# Patient Record
Sex: Male | Born: 1985 | Race: White | Hispanic: No | Marital: Married | Smoking: Current every day smoker
Health system: Southern US, Community
[De-identification: ages and names within clinical notes are randomized; demographics above are authoritative.]

## PROBLEM LIST (undated history)

## (undated) DIAGNOSIS — Z8619 Personal history of other infectious and parasitic diseases: Secondary | ICD-10-CM

## (undated) DIAGNOSIS — G43909 Migraine, unspecified, not intractable, without status migrainosus: Secondary | ICD-10-CM

## (undated) DIAGNOSIS — Z8659 Personal history of other mental and behavioral disorders: Secondary | ICD-10-CM

## (undated) DIAGNOSIS — R51 Headache: Secondary | ICD-10-CM

## (undated) HISTORY — DX: Headache: R51

## (undated) HISTORY — DX: Personal history of other infectious and parasitic diseases: Z86.19

## (undated) HISTORY — DX: Migraine, unspecified, not intractable, without status migrainosus: G43.909

## (undated) HISTORY — PX: HERNIA REPAIR: SHX51

## (undated) HISTORY — DX: Personal history of other mental and behavioral disorders: Z86.59

---

## 2009-09-07 ENCOUNTER — Ambulatory Visit: Payer: Self-pay | Admitting: Family Medicine

## 2010-02-05 ENCOUNTER — Encounter: Payer: Self-pay | Admitting: Family Medicine

## 2010-03-02 ENCOUNTER — Ambulatory Visit: Payer: Self-pay | Admitting: Family Medicine

## 2010-03-02 DIAGNOSIS — R519 Headache, unspecified: Secondary | ICD-10-CM | POA: Insufficient documentation

## 2010-03-02 DIAGNOSIS — R7402 Elevation of levels of lactic acid dehydrogenase (LDH): Secondary | ICD-10-CM | POA: Insufficient documentation

## 2010-03-02 DIAGNOSIS — Z9189 Other specified personal risk factors, not elsewhere classified: Secondary | ICD-10-CM | POA: Insufficient documentation

## 2010-03-02 DIAGNOSIS — R740 Nonspecific elevation of levels of transaminase and lactic acid dehydrogenase [LDH]: Secondary | ICD-10-CM

## 2010-03-02 DIAGNOSIS — F17200 Nicotine dependence, unspecified, uncomplicated: Secondary | ICD-10-CM | POA: Insufficient documentation

## 2010-03-02 DIAGNOSIS — F329 Major depressive disorder, single episode, unspecified: Secondary | ICD-10-CM

## 2010-03-02 DIAGNOSIS — R51 Headache: Secondary | ICD-10-CM

## 2010-03-07 ENCOUNTER — Ambulatory Visit: Payer: Self-pay | Admitting: Family Medicine

## 2010-03-09 LAB — CONVERTED CEMR LAB
ALT: 56 units/L — ABNORMAL HIGH (ref 0–53)
AST: 36 units/L (ref 0–37)
Albumin: 4.4 g/dL (ref 3.5–5.2)
BUN: 17 mg/dL (ref 6–23)
CO2: 30 meq/L (ref 19–32)
Cholesterol: 219 mg/dL — ABNORMAL HIGH (ref 0–200)
GFR calc non Af Amer: 88.02 mL/min (ref >60)
Glucose, Bld: 98 mg/dL (ref 70–99)
Potassium: 4.3 meq/L (ref 3.5–5.1)
Total Protein: 6.7 g/dL (ref 6.0–8.3)
VLDL: 34.6 mg/dL (ref 0.0–40.0)

## 2010-05-22 NOTE — Assessment & Plan Note (Signed)
Summary: NEW PT TO EST/CLE   Vital Signs:  Patient profile:   25 year old male Height:      74 inches Weight:      273.25 pounds BMI:     35.21 Temp:     97.7 degrees F oral Pulse rate:   84 / minute Pulse rhythm:   regular BP sitting:   106 / 70  (left arm) Cuff size:   large  Vitals Entered By: Delilah Shan CMA Duncan Dull) (March 02, 2010 9:55 AM) CC: New patient to Establish   History of Present Illness: Tob- prev good effect with chantix.  Prev didn't tolerate wellbutrin with mild increase in LFTs per patient.   H/o headaches- R sided, behind the eye.  Last  ~45 min.  Less frequently now.  Prev were happening most days.  Less severe now.  Takes aleve for it and does well with this.  Smoking, 1-2 sprite or pepsi per day.  Some tea.    Due for flu shot.   Preventive Screening-Counseling & Management  Alcohol-Tobacco     Smoking Status: current  Caffeine-Diet-Exercise     Does Patient Exercise: no      Drug Use:  no.    Allergies (verified): No Known Drug Allergies  Past History:  Past Medical History: HEADACHE (ICD-784.0) DEPRESSION (ICD-311); symptoms during time near father's death, resolved MIGRAINE HEADACHE (ICD-346.90) CHICKENPOX, HX OF (ICD-V15.9)    Past Surgical History: Denies surgical history  Family History: Reviewed history and no changes required. Family History Breast cancer 1st degree relative <50, Parents Family History of Colon CA 1st degree relative <60, Parents Family History Diabetes 1st degree relative, Parents Family History of Stroke F 1st degree relative <60, parents M alive, breast CA treated, DM2 F died 19-Sep-2003, multiple sclerosis, colon CA  Sep 18, 2001, died of CVA  Social History: Reviewed history and no changes required. Occupation:  Publishing copy, Medical laboratory scientific officer in [employer] Education:  [employer] Current Smoker, 3/4 PPD as of 18-Sep-2009, prev quit with chantix Alcohol use-yes, a few a week Drug use-no Regular exercise-no From [place],  moved to [place] at age 36.   Married 2007Occupation:  employed Smoking Status:  current Drug Use:  no Does Patient Exercise:  no  Review of Systems       See HPI.  Otherwise negative.    Physical Exam  General:  GEN: nad, alert and oriented HEENT: mucous membranes moist, TM wnl, nasal and OP exam unremarkable NECK: supple w/o LA CV: rrr.  no murmur PULM: ctab, no inc wob ABD: soft, +bs EXT: no edema SKIN: no acute rash    Impression & Recommendations:  Problem # 1:  NONSPEC ELEVATION OF LEVELS OF TRANSAMINASE/LDH (ICD-790.4) Requesting records.    Problem # 2:  TOBACCO ABUSE (ICD-305.1) Rx given with routine precautions.  His updated medication list for this problem includes:    Chantix Starting Month Pak 0.5 Mg X 11 & 1 Mg X 42 Tabs (Varenicline tartrate) .Marland Kitchen... Take as directed    Chantix Continuing Month Pak 1 Mg Tabs (Varenicline tartrate) .Marland Kitchen... Take as directed  Problem # 3:  HEADACHE (ICD-784.0) I d/w patient about smoking.  This may be a migraine and cutting tobacco and soda may help.  follow up as needed.  He agrees.   Complete Medication List: 1)  Milk Thistle 500 Mg Caps (Milk thistle) .... Take 2  capsules  by mouth two times a day 2)  Chantix Starting Month Pak 0.5 Mg X 11 &  1 Mg X 42 Tabs (Varenicline tartrate) .... Take as directed 3)  Chantix Continuing Month Pak 1 Mg Tabs (Varenicline tartrate) .... Take as directed  Patient Instructions: 1)  I would try to get back on chantix.  Let me know if you don't tolerate the medicine or if you have other concerns.  2)  Come back for fasting labs in the next few weeks. You can set this up today.  3)  cmet- 790.4 4)  lipid- v18.0 5)  Take care.  Glad to see you today.  Prescriptions: CHANTIX CONTINUING MONTH PAK 1 MG TABS (VARENICLINE TARTRATE) take as directed  #1 pack x 1   Entered and Authorized by:   Crawford Givens MD   Signed by:   Crawford Givens MD on 03/02/2010   Method used:   Print then Give to Patient    RxID:   [id number] CHANTIX STARTING MONTH PAK 0.5 MG X 11 & 1 MG X 42 TABS (VARENICLINE TARTRATE) take as directed  #1 pack x 0   Entered and Authorized by:   Crawford Givens MD   Signed by:   Crawford Givens MD on 03/02/2010   Method used:   Print then Give to Patient   RxID:   [id number]    Orders Added: 1)  New Patient Level III [84696]   Immunization History:  Tetanus/Td Immunization History:    Tetanus/Td:  historical (04/22/2001)   Immunization History:  Tetanus/Td Immunization History:    Tetanus/Td:  Historical (04/22/2001)  Current Allergies (reviewed today): No known allergies    Appended Document: NEW PT TO EST/CLE Flu Vaccine Consent Questions     Do you have a history of severe allergic reactions to this vaccine? no    Any prior history of allergic reactions to egg and/or gelatin? no    Do you have a sensitivity to the preservative Thimersol? no    Do you have a past history of Guillan-Barre Syndrome? no    Do you currently have an acute febrile illness? no    Have you ever had a severe reaction to latex? no    Vaccine information given and explained to patient? yes    Are you currently pregnant? no    Lot Number:[id number]   Exp Date:10/20/2010   Site Given  Left Deltoid IM Lugene Fuquay CMA (AAMA)  March 05, 2010 8:34 AM

## 2010-05-22 NOTE — Letter (Signed)
Summary: [facility] Open [facility] on [facility]  [facility]   Imported By: Maryln Gottron 03/19/2010 09:11:36  _____________________________________________________________________  External Attachment:    Type:   Image     Comment:   External Document

## 2010-07-09 ENCOUNTER — Encounter: Payer: Self-pay | Admitting: Family Medicine

## 2010-07-09 ENCOUNTER — Ambulatory Visit (INDEPENDENT_AMBULATORY_CARE_PROVIDER_SITE_OTHER): Payer: BC Managed Care – PPO | Admitting: Family Medicine

## 2010-07-09 DIAGNOSIS — J019 Acute sinusitis, unspecified: Secondary | ICD-10-CM | POA: Insufficient documentation

## 2010-07-09 LAB — CONVERTED CEMR LAB: Rapid Strep: NEGATIVE

## 2010-07-19 NOTE — Assessment & Plan Note (Signed)
Summary: ST,SINUS PRESSURE,DRAINAGE/CLE  [employer]   Vital Signs:  Patient profile:   25 year old male Height:      74 inches Weight:      277.25 pounds BMI:     35.73 O2 Sat:      98 % on Room air Temp:     98.2 degrees F oral Pulse rate:   76 / minute Pulse rhythm:   regular BP sitting:   132 / 78  (left arm) Cuff size:   large  Vitals Entered By: Delilah Shan CMA Jeanell Mangan Dull) (July 09, 2010 12:11 PM)  O2 Flow:  Room air CC: ST, sinus pressure, drainage   History of Present Illness: Middle of last week started with ST in AM.  Rhinorrhea and postnasal gtt.  ST increased this AM.  Cough and bending over--> pressure in head, brief.  No FCNAV.  Took generic otc allergy meds w/o much relief.    Cutting down to 50% of smoking.    Allergies: No Known Drug Allergies  Review of Systems       See HPI.  Otherwise negative.    Physical Exam  General:  GEN: nad, alert and oriented HEENT: mucous membranes moist, TM w/o erythema, nasal epithelium injected, OP with cobblestoning NECK: supple w/o LA CV: rrr. PULM: ctab, no inc wob ABD: soft, +bs EXT: no edema    Impression & Recommendations:  Problem # 1:  SINUSITIS - ACUTE-NOS (ICD-461.9) Will hold antibiotics for now.  He is minimally tender to palpation on the frontal sinuses.  start antibiotics if not improved in next few days.  nasal saline in meantime.  He agrees.  Nontoxic.  follow up as needed. Supportive tx o/w in meantime.   His updated medication list for this problem includes:    Amoxicillin 875 Mg Tabs (Amoxicillin) .Marland Kitchen... 1 by mouth two times a day  Complete Medication List: 1)  Amoxicillin 875 Mg Tabs (Amoxicillin) .Marland Kitchen.. 1 by mouth two times a day  Other Orders: Rapid Strep ([address])  Patient Instructions: 1)  Hold the antibiotics for now.  Start them on Wednesday if you aren't improving.  Use nasal saline and rest in the meantime.  Take care.   Prescriptions: AMOXICILLIN 875 MG TABS (AMOXICILLIN) 1 by mouth two  times a day  #20 x 0   Entered and Authorized by:   Crawford Givens MD   Signed by:   Crawford Givens MD on 07/09/2010   Method used:   Print then Give to Patient   RxID:   [id number]    Orders Added: 1)  Rapid Strep [95621] 2)  Est. Patient Level III [30865]    Current Allergies (reviewed today): No known allergies   Laboratory Results  Date/Time Received: July 09, 2010 12:18 PM   Other Tests  Rapid Strep: negative

## 2010-07-19 NOTE — Letter (Signed)
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          Sincerely,    Crawford Givens MD

## 2011-07-31 ENCOUNTER — Encounter: Payer: Self-pay | Admitting: Family Medicine

## 2011-07-31 ENCOUNTER — Ambulatory Visit (INDEPENDENT_AMBULATORY_CARE_PROVIDER_SITE_OTHER): Payer: BC Managed Care – PPO | Admitting: Family Medicine

## 2011-07-31 VITALS — BP 108/68 | HR 76 | Temp 98.7°F | Ht 73.5 in | Wt 235.2 lb

## 2011-07-31 DIAGNOSIS — K5289 Other specified noninfective gastroenteritis and colitis: Secondary | ICD-10-CM

## 2011-07-31 DIAGNOSIS — K529 Noninfective gastroenteritis and colitis, unspecified: Secondary | ICD-10-CM | POA: Insufficient documentation

## 2011-07-31 MED ORDER — ONDANSETRON HCL 4 MG PO TABS: 4.0000 mg | ORAL_TABLET | Freq: Three times a day (TID) | ORAL | Status: AC | PRN

## 2011-07-31 NOTE — Progress Notes (Signed)
Yesterday he was fine.  Ate a late lunch.  Had repeated diarrhea last night.  Vomited last night mult times.  Episodically with both.  HA, aches and pains.  No fevers.  Diarrhea not bloody, black.  Vomitus is food and then thin liquids/dry heaves.  Taking some sips of fluids.  No solids.  He was a little lightheaded this AM.  Last UOP this AM, urine was still light.  No known sick contacts.  No new foods.    Meds, vitals, and allergies reviewed.   ROS: See HPI.  Otherwise, noncontributory.  nad ncat Mmm rrr ctab abd soft, not ttp, normal BS, no rebound Ext w/o edema, normal skin turgor and perfusion

## 2011-07-31 NOTE — Assessment & Plan Note (Signed)
Presumed norovirus, recently in community with similar sx. Not dehydrated by exam, okay for outpatient f/u.  Path/phys d/w pt. He understood.  Use zofran prn for NAV.  Sips of fluids o/w.

## 2011-07-31 NOTE — Patient Instructions (Addendum)
Take the zofran for nausea, take drink sips of nondairy fluids and this should gradually get better.

## 2011-08-02 ENCOUNTER — Telehealth: Payer: Self-pay | Admitting: Family Medicine

## 2011-08-02 NOTE — Telephone Encounter (Signed)
Message left at contact number.  Note left at front desk for pick up or awaiting further instructions.

## 2011-08-02 NOTE — Telephone Encounter (Signed)
Printed

## 2011-08-02 NOTE — Telephone Encounter (Signed)
Patient needs doctor's note to return to work on Monday.

## 2016-04-26 ENCOUNTER — Other Ambulatory Visit: Payer: Self-pay | Admitting: Unknown Physician Specialty

## 2016-04-26 ENCOUNTER — Ambulatory Visit: Admission: RE | Admit: 2016-04-26 | Payer: Self-pay | Source: Ambulatory Visit

## 2016-04-26 DIAGNOSIS — M5432 Sciatica, left side: Secondary | ICD-10-CM

## 2016-05-03 ENCOUNTER — Ambulatory Visit
Admission: RE | Admit: 2016-05-03 | Discharge: 2016-05-03 | Disposition: A | Discharge status: Home / Self Care | Payer: Managed Care, Other (non HMO) | Source: Ambulatory Visit | Attending: Unknown Physician Specialty | Admitting: Unknown Physician Specialty

## 2016-05-03 ENCOUNTER — Ambulatory Visit: Payer: Self-pay

## 2016-05-03 ENCOUNTER — Other Ambulatory Visit: Payer: Self-pay | Admitting: Unknown Physician Specialty

## 2016-05-03 DIAGNOSIS — M5441 Lumbago with sciatica, right side: Secondary | ICD-10-CM

## 2016-05-03 DIAGNOSIS — M5126 Other intervertebral disc displacement, lumbar region: Secondary | ICD-10-CM | POA: Insufficient documentation

## 2016-05-03 DIAGNOSIS — M5432 Sciatica, left side: Secondary | ICD-10-CM | POA: Diagnosis present

## 2016-05-03 DIAGNOSIS — M5127 Other intervertebral disc displacement, lumbosacral region: Secondary | ICD-10-CM | POA: Insufficient documentation

## 2017-11-18 IMAGING — MR MR LUMBAR SPINE W/O CM
4 of 5 series · 16 of 48 positions shown · non-contrast
Comparison: None.

CLINICAL DATA: Low back pain and left leg pain and numbness.

EXAM:
MRI LUMBAR SPINE WITHOUT CONTRAST
TECHNIQUE: Multiplanar, multisequence MR imaging of the lumbar spine was
performed. No intravenous contrast was administered.

[Series 2: T2 · sagittal · 4.0mm · 0.44mm/px · 5 of 16 slices shown (1 of 2)]
[im 1/16]
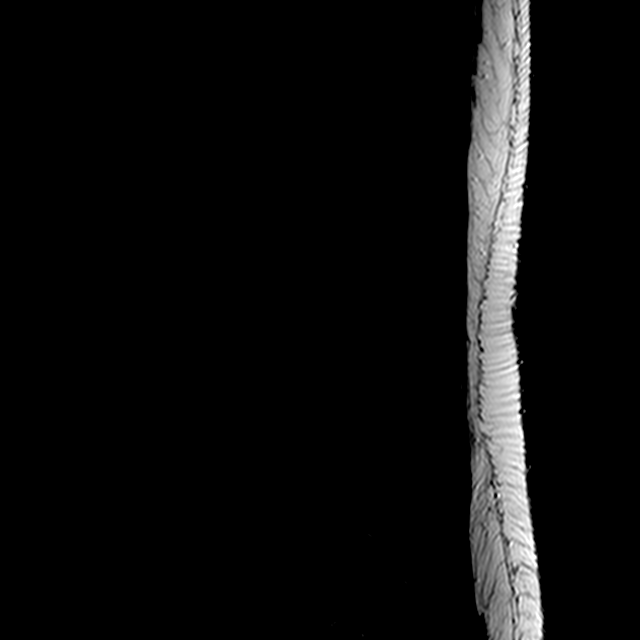
[im 4/16]
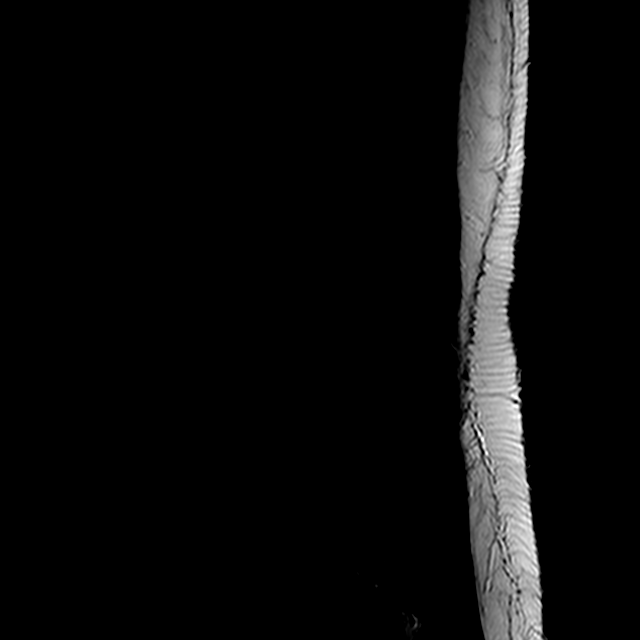
[im 8/16]
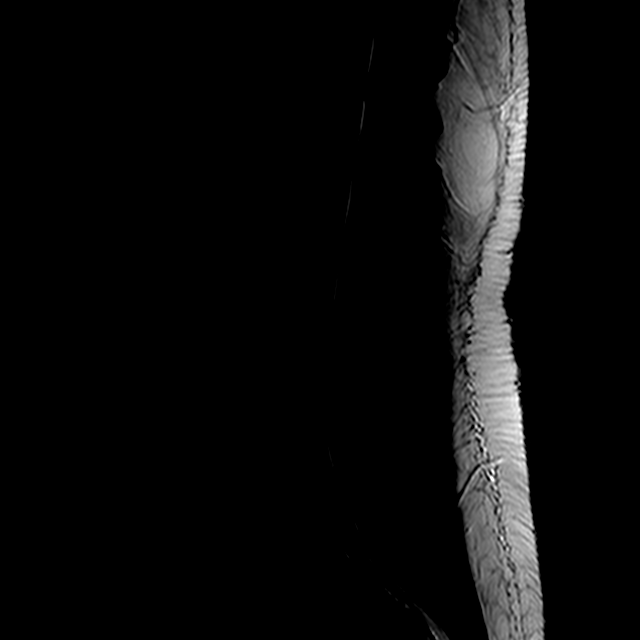
[im 12/16]
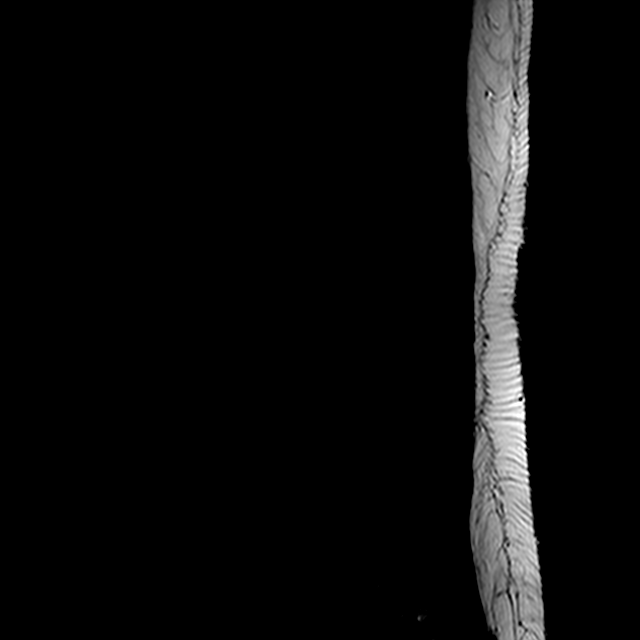
[im 16/16]
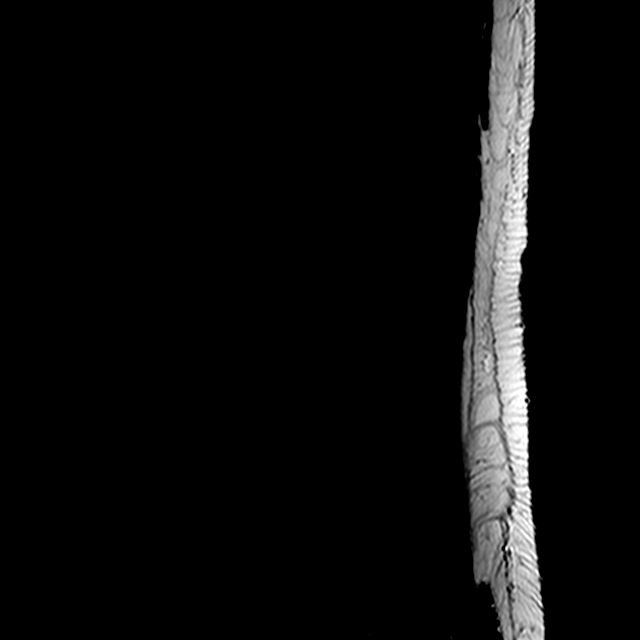

[Series 3: T1 · sagittal · 4.0mm · 0.44mm/px · 3 of 16 slices shown (1 of 2)]
[im 1/16]
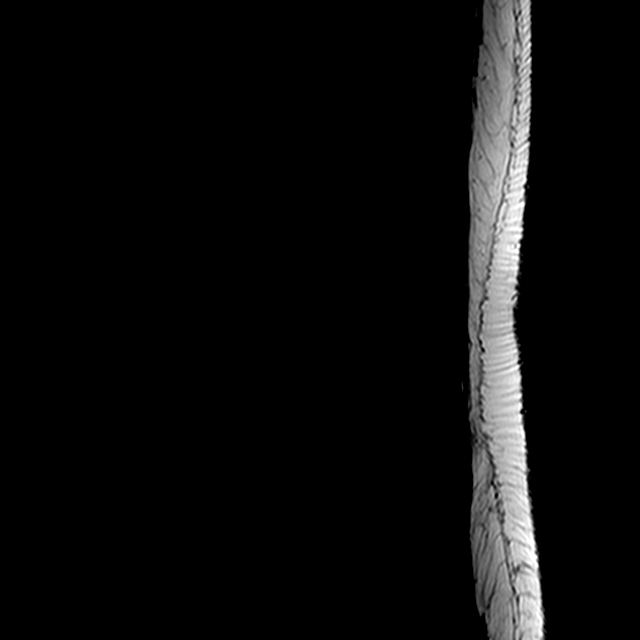
[im 8/16]
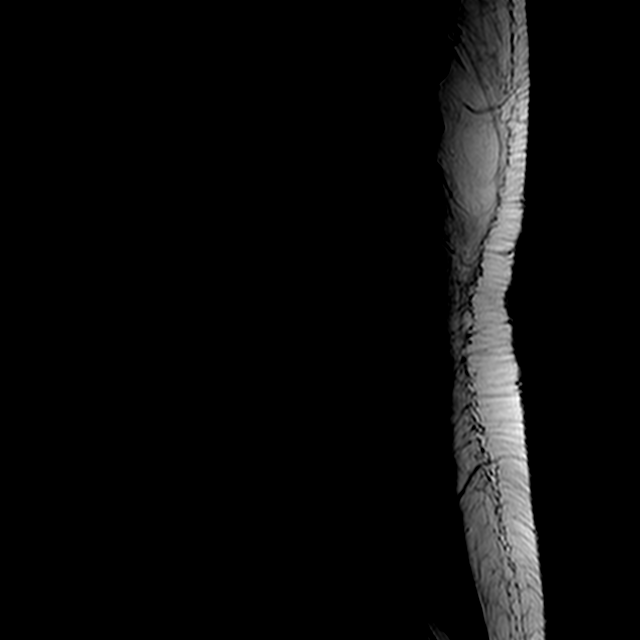
[im 16/16]
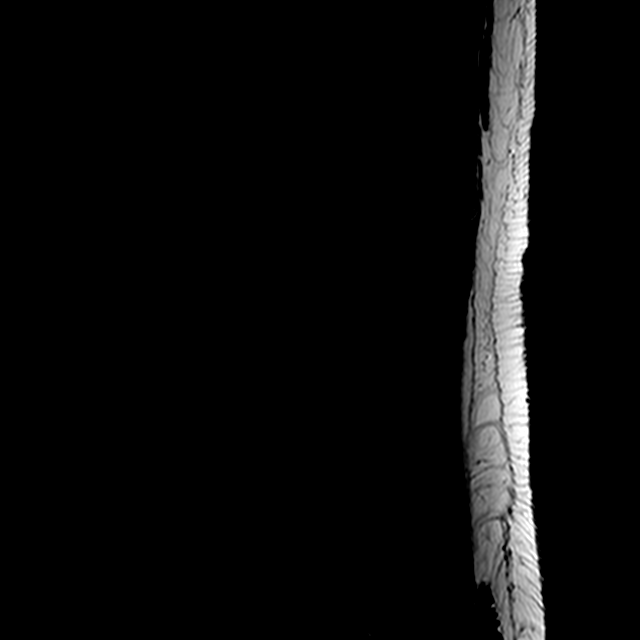

[Series 5: T2 · axial · 4.0mm · 0.39mm/px · z∈[-113,+102]mm · 5 of 45 slices shown (2 of 2)]
[im 3/45]
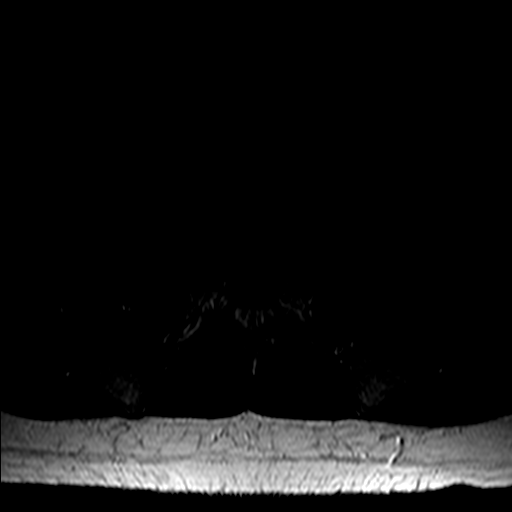
[im 6/45]
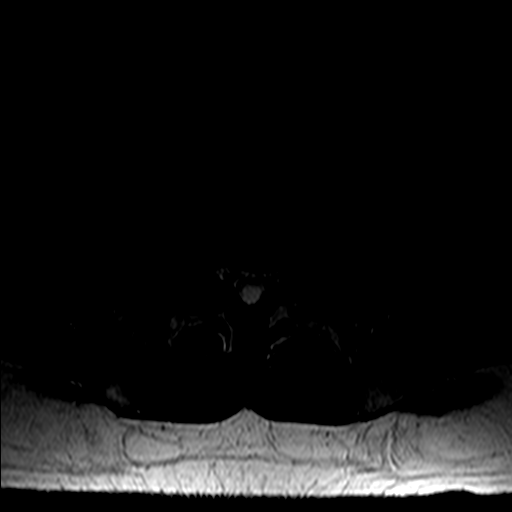
[im 9/45]
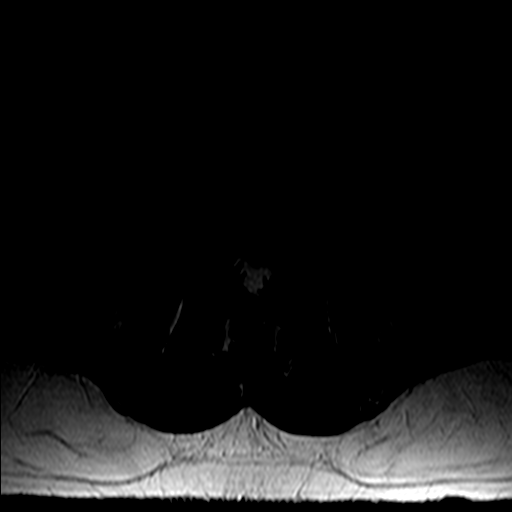
[im 24/45]
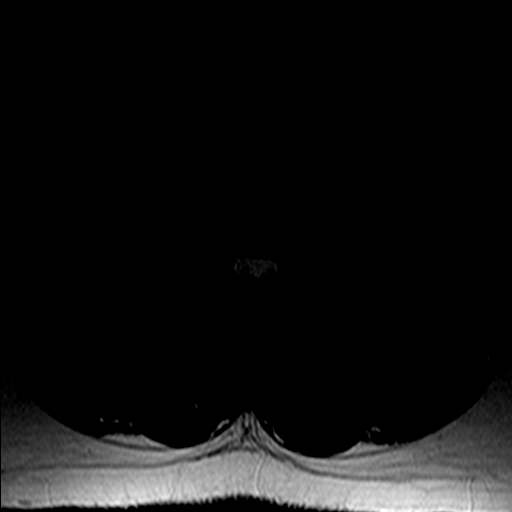
[im 39/45]
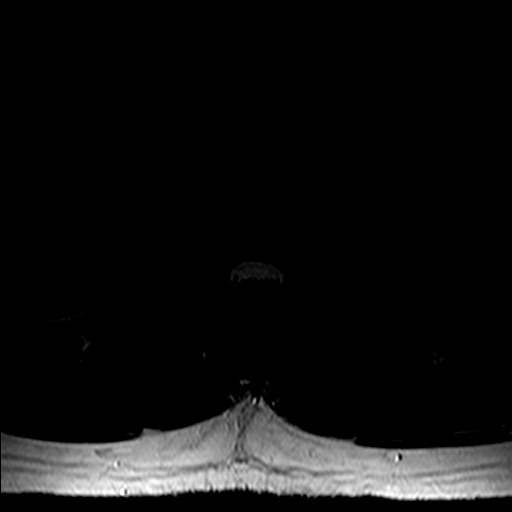

[Series 6: T1 · axial · 4.0mm · 0.39mm/px · z∈[-98,+102]mm · 3 of 45 slices shown (2 of 2)]
[im 6/45]
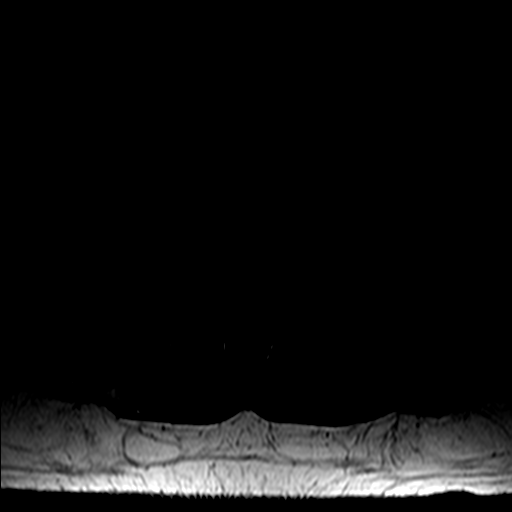
[im 24/45]
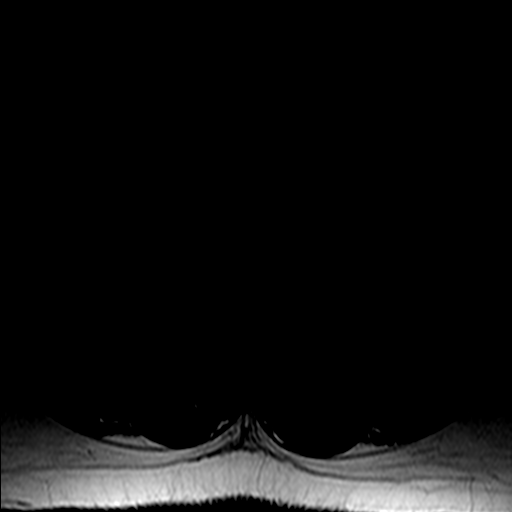
[im 39/45]
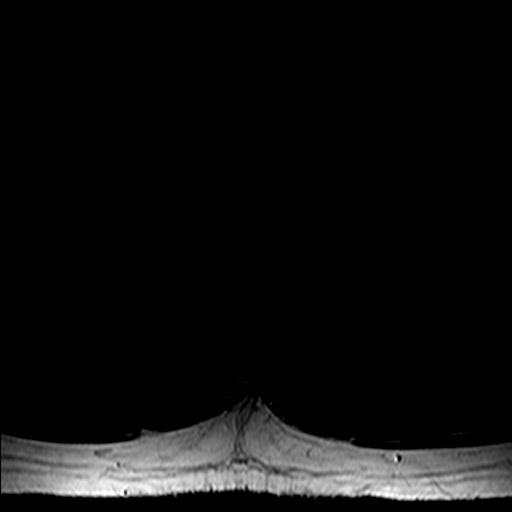

[16 of 48 positions shown; findings below may reference images not displayed]

FINDINGS: Segmentation:  Standard.

Alignment:  Physiologic.

Vertebrae:  No fracture, evidence of discitis, or bone lesion.

Conus medullaris: Extends to the L1 level and appears normal.

Paraspinal and other soft tissues: Negative.

Disc levels:

T11-12 through L2-3: Normal.

L3-4: Small central subligamentous disc protrusion slightly
asymmetric to the right minimally compresses the thecal sac but
creates no focal nerve impingement.

L4-5:  Normal.

L5-S1: Soft disc protrusion central and to the left focally
compressing the left S1 nerve root sleeve best seen on image 38 of
series 5.
IMPRESSION: 1. Small focal soft disc protrusion central and to the left at L5-S1
compressing the left S1 nerve.
2. Small central subligamentous disc protrusion at L3-4 slightly
asymmetric to the right without neural impingement.

## 2021-05-25 ENCOUNTER — Other Ambulatory Visit: Payer: Self-pay

## 2021-05-25 ENCOUNTER — Ambulatory Visit
Admission: RE | Admit: 2021-05-25 | Discharge: 2021-05-25 | Disposition: A | Discharge status: Home / Self Care | Payer: Managed Care, Other (non HMO) | Source: Ambulatory Visit | Attending: Emergency Medicine | Admitting: Emergency Medicine

## 2021-05-25 VITALS — BP 130/84 | HR 81 | Temp 98.5°F | Resp 20

## 2021-05-25 DIAGNOSIS — Z23 Encounter for immunization: Secondary | ICD-10-CM

## 2021-05-25 DIAGNOSIS — S81831A Puncture wound without foreign body, right lower leg, initial encounter: Secondary | ICD-10-CM

## 2021-05-25 MED ORDER — TETANUS-DIPHTHERIA TOXOIDS TD 5-2 LFU IM INJ: 0.5000 mL | INJECTION | Freq: Once | INTRAMUSCULAR | Status: DC

## 2021-05-25 MED ORDER — TETANUS-DIPHTH-ACELL PERTUSSIS 5-2.5-18.5 LF-MCG/0.5 IM SUSY
0.5000 mL | PREFILLED_SYRINGE | Freq: Once | INTRAMUSCULAR | Status: AC
  Administered 2021-05-25: 0.5 mL via INTRAMUSCULAR

## 2021-05-25 NOTE — Discharge Instructions (Signed)
Please make a note in your calendar or on your phone that you received a Tdap booster today (that stands for tetanus, diphtheria and pertussis).  Your next booster shot would officially be due in 10 years but as you may be aware, if you have a similar deep injury or dog bite after 5 years before your 10-year booster, you should get another tetanus booster.  As we discussed, I do not believe that there is any metal remaining in your leg given lack of redness, inflammation and likelihood that you were able to pull out the entire piece intact quickly.    If you do begin to experience inflammation, redness, your ankle joint I recommend that you go to the [facility] to have an x-ray done of your right ankle.  This has been ordered for you, you just need to go to the main entrance during regular business hours to let them know, they will not need to admit you as a patient.  At this time, I do not believe that antibiotics are indicated but certainly if any of the signs of infection listed in the attached instructions appear, please do not hesitate to give [place] a call and I will send a prescription for antibiotics right away.  Thank you for visiting urgent care today.  I appreciate the opportunity to participate in your care.

## 2021-05-25 NOTE — ED Provider Notes (Signed)
[facility]    CSN: [id number] Arrival date & time: 05/25/21  [id number]    HISTORY   Chief Complaint  Patient presents with   Abrasion   HPI Aaron Mathis is a 36 y.o. male. 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  Patient states when he looked down, there was a piece of metal sticking out from his leg, when he pulled it out about three quarters of an inch have been buried into his leg.  Patient states he feels like he got it all out but came in to be evaluated just to make sure.  Patient states he thinks has been about 20 years since he has had a tetanus booster.  Patient states the area is no longer bleeding, denies any redness, swelling, drainage from the puncture wound.  Denies any pain in his ankle with any particular movements or walking.  States that the piece of wire he pulled from his leg was wound appears wound appears patient's wound appears uncontaminated appears uncontaminated and healing well at this time.  There is no palpable mass appreciated.  X-ray return precautions advised, patient received a tetanus booster.  Return precautions advised return return connected to the car and was old, states he does not know how clean it was.  The history is provided by the patient.  Past Medical History:  Diagnosis Date   Headache(784.0)    History of chickenpox    History of depression    symptoms during time near father's dealth, resolved   Migraine headache    Patient Active Problem List   Diagnosis Date Noted   AGE (acute gastroenteritis) 07/31/2011   TOBACCO ABUSE 03/02/2010   DEPRESSION 03/02/2010   HEADACHE 03/02/2010   NONSPEC ELEVATION OF LEVELS OF TRANSAMINASE/LDH 03/02/2010   CHICKENPOX, HX OF 03/02/2010   History reviewed. No pertinent surgical history.  Home Medications    Prior to Admission medications   Medication Sig  Start Date End Date Taking? Authorizing Provider  bismuth subsalicylate (PEPTO BISMOL) 262 MG chewable tablet Chew 524 mg by mouth as needed.    [provider]  Loperamide HCl (IMODIUM A-D PO) Take by mouth as needed.    [provider]    Family History Family History  Problem Relation Age of Onset   Cancer Mother        breast   Diabetes Mother    Multiple sclerosis Father    Cancer Father        colon ~2003   Stroke Father    Cancer Other        breast, colon   Diabetes Other    Stroke Other    Social History Social History   Tobacco Use   Smoking status: Every Day    Packs/day: 0.30    Years: 8.00    Pack years: 2.40    Types: Cigarettes   Smokeless tobacco: Never  Substance Use Topics   Alcohol use: Yes    Comment: occassionally   Drug use: No   Allergies   Patient has no known allergies.  Review of Systems Review of Systems Pertinent findings noted in history of present illness.   Physical Exam Triage Vital Signs ED Triage Vitals  Enc Vitals Group     BP 02/16/21 0827 (!) 147/82     Pulse Rate 02/16/21 0827 72  Resp 02/16/21 0827 18     Temp 02/16/21 0827 98.3 F (36.8 C)     Temp Source 02/16/21 0827 Oral     SpO2 02/16/21 0827 98 %     Weight --      Height --      Head Circumference --      Peak Flow --      Pain Score 02/16/21 0826 5     Pain Loc --      Pain Edu? --      Excl. in GC? --   No data found.  Updated Vital Signs BP 130/84 (BP Location: Left Arm)    Pulse 81    Temp 98.5 F (36.9 C) (Oral)    Resp 20    SpO2 97%   Physical Exam Vitals and nursing note reviewed.  Constitutional:      General: He is not in acute distress.    Appearance: Normal appearance. He is not ill-appearing.  HENT:     Head: Normocephalic and atraumatic.  Eyes:     General: Lids are normal.        Right eye: No discharge.        Left eye: No discharge.     Extraocular Movements: Extraocular movements intact.      Conjunctiva/sclera: Conjunctivae normal.     Right eye: Right conjunctiva is not injected.     Left eye: Left conjunctiva is not injected.  Neck:     Trachea: Trachea and phonation normal.  Cardiovascular:     Rate and Rhythm: Normal rate and regular rhythm.     Pulses: Normal pulses.     Heart sounds: Normal heart sounds. No murmur heard.   No friction rub. No gallop.  Pulmonary:     Effort: Pulmonary effort is normal. No accessory muscle usage, prolonged expiration or respiratory distress.     Breath sounds: Normal breath sounds. No stridor, decreased air movement or transmitted upper airway sounds. No decreased breath sounds, wheezing, rhonchi or rales.  Chest:     Chest wall: No tenderness.  Musculoskeletal:        General: Normal range of motion.     Cervical back: Normal range of motion and neck supple. Normal range of motion.  Lymphadenopathy:     Cervical: No cervical adenopathy.  Skin:    General: Skin is warm and dry.     Findings: Lesion (Multiple linear abrasions on the posterior aspect of right lower leg just above the Achilles tendon, hemostasis has been achieved with blood clot, there is no appreciable erythema, swelling, drainage or visible or palpable foreign body appreciated) present. No erythema or rash.  Neurological:     General: No focal deficit present.     Mental Status: He is alert and oriented to person, place, and time.  Psychiatric:        Mood and Affect: Mood normal.        Behavior: Behavior normal.    Visual Acuity Right Eye Distance:   Left Eye Distance:   Bilateral Distance:    Right Eye Near:   Left Eye Near:    Bilateral Near:     UC Couse / Diagnostics / Procedures:    EKG  Radiology No results found.  Procedures Procedures (including critical care time)  UC Diagnoses / Final Clinical Impressions(s)   I have reviewed the triage vital signs and the nursing notes.  Pertinent labs & imaging results that were available during my  care  of the patient were reviewed by me and considered in my medical decision making (see chart for details).    Final diagnoses:  Puncture wound of right lower leg, initial encounter   Patient's wound appears uncontaminated at this time, there is no palpable mass appreciated on exam.  X-ray ordered, patient advised to have this done if he experiences pain, redness or swelling in ankle or wound.  Patient advised to call if he sees any signs of infection as listed in the puncture wound handout for him, we will be happy to prescribe some Keflex for 7 days.  Return precautions advised, tdap provided.    ED Prescriptions   None    PDMP not reviewed this encounter.  Pending results:  Labs Reviewed - No data to display  Medications Ordered in UC: Medications  tetanus & diphtheria toxoids (adult) (TENIVAC) injection 0.5 mL (has no administration in time range)    Disposition Upon Discharge:  Condition: stable for discharge home Home: take medications as prescribed; routine discharge instructions as discussed; follow up as advised.  Patient presented with an acute illness with associated systemic symptoms and significant discomfort requiring urgent management. In my opinion, this is a condition that a prudent lay person (someone who possesses an average knowledge of health and medicine) may potentially expect to result in complications if not addressed urgently such as respiratory distress, impairment of bodily function or dysfunction of bodily organs.   Routine symptom specific, illness specific and/or disease specific instructions were discussed with the patient and/or caregiver at length.   As such, the patient has been evaluated and assessed, work-up was performed and treatment was provided in alignment with urgent care protocols and evidence based medicine.  Patient/parent/caregiver has been advised that the patient may require follow up for further testing and treatment if the symptoms  continue in spite of treatment, as clinically indicated and appropriate.  If the patient was tested for COVID-19, Influenza and/or RSV, then the patient/parent/guardian was advised to isolate at home pending the results of his/her diagnostic coronavirus test and potentially longer if theyre positive. I have also advised pt that if his/her COVID-19 test returns positive, it's recommended to self-isolate for at least 10 days after symptoms first appeared AND until fever-free for 24 hours without fever reducer AND other symptoms have improved or resolved. Discussed self-isolation recommendations as well as instructions for household member/close contacts as per the [facility] and [place] DHHS, and also gave patient the COVID packet with this information.  Patient/parent/caregiver has been advised to return to the [facility] or PCP in 3-5 days if no better; to PCP or the Emergency Department if new signs and symptoms develop, or if the current signs or symptoms continue to change or worsen for further workup, evaluation and treatment as clinically indicated and appropriate  The patient will follow up with their current PCP if and as advised. If the patient does not currently have a PCP we will assist them in obtaining one.   The patient may need specialty follow up if the symptoms continue, in spite of conservative treatment and management, for further workup, evaluation, consultation and treatment as clinically indicated and appropriate.   Patient/parent/caregiver verbalized understanding and agreement of plan as discussed.  All questions were addressed during visit.  Please see discharge instructions below for further details of plan.  Discharge Instructions:   Discharge Instructions      Please make a note in your calendar or on your phone that you received a Tdap booster  today (that stands for tetanus, diphtheria and pertussis).  Your next booster shot would officially be due in 10 years but as you may be aware, if  you have a similar deep injury or dog bite after 5 years before your 10-year booster, you should get another tetanus booster.  As we discussed, I do not believe that there is any metal remaining in your leg given lack of redness, inflammation and likelihood that you were able to pull out the entire piece intact quickly.    If you do begin to experience inflammation, redness, your ankle joint I recommend that you go to the [facility] at [facility] to have an x-ray done of your right ankle.  This has been ordered for you, you just need to go to the main entrance during regular business hours to let them know, they will not need to admit you as a patient.  At this time, I do not believe that antibiotics are indicated but certainly if any of the signs of infection listed in the attached instructions appear, please do not hesitate to give us a call and I will send a prescription for antibiotics right away.  Thank you for visiting urgent care today.  I appreciate the opportunity to participate in your care.      This office note has been dictated using Teaching laboratory technicianDragon speech recognition software.  Unfortunately, and despite my best efforts, this method of dictation can sometimes lead to occasional typographical or grammatical errors.  I apologize in advance if this occurs.     Theadora RamaMorgan, Brandolyn Shortridge Scales, [place] JerseyPA-C 05/25/21 [phone]

## 2021-05-25 NOTE — ED Triage Notes (Addendum)
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 Pt states that a piece of the metal did get into the leg but he removed what he could see.

## 2021-10-09 ENCOUNTER — Other Ambulatory Visit: Payer: Self-pay

## 2021-10-09 ENCOUNTER — Ambulatory Visit (INDEPENDENT_AMBULATORY_CARE_PROVIDER_SITE_OTHER): Payer: Self-pay | Admitting: Gastroenterology

## 2021-10-09 ENCOUNTER — Encounter: Payer: Self-pay | Admitting: Gastroenterology

## 2021-10-09 VITALS — BP 129/85 | HR 75 | Temp 98.5°F | Ht 74.0 in | Wt 266.0 lb

## 2021-10-09 DIAGNOSIS — Z800 Family history of malignant neoplasm of digestive organs: Secondary | ICD-10-CM

## 2021-10-09 MED ORDER — NA SULFATE-K SULFATE-MG SULF 17.5-3.13-1.6 GM/177ML PO SOLN: 354.0000 mL | Freq: Once | ORAL | 0 refills | Status: AC

## 2021-10-09 NOTE — Addendum Note (Signed)
Addended by: Adela Ports on: 10/09/2021 02:01 PM   Modules accepted: Orders

## 2021-10-09 NOTE — Progress Notes (Signed)
Wyline Mood MD, MRCP(U.K) [address]  [address]  [place], [place] [address]  Main: [phone]  Fax: [phone]   Gastroenterology Consultation  Referring Provider:     Ceasar Lund, [place] Primary Care Physician:  Joaquim Nam, MD Primary Gastroenterologist:  Dr. Wyline Mood  Reason for Consultation:     Father had colon cancer 46        HPI:   Aaron Mathis is a 36 y.o. y/o male referred for consultation & management  by Dr. Para March, Dwana Curd, MD.     He is here today to see me for setting up a colonoscopy.  His father had colon cancer at age of 27 went through chemotherapy and subsequently passed away from a stroke.  He has had sisters who have had colon polyps in their 26s.  He has never had a colonoscopy.  No change in bowel habits.  No unintentional weight loss.  He works at [employer] at [employer].  Father never had genetic testing performed.  Past Medical History:  Diagnosis Date   Headache(784.0)    History of chickenpox    History of depression    symptoms during time near father's dealth, resolved   Migraine headache     No past surgical history on file.  Prior to Admission medications   Not on File    Family History  Problem Relation Age of Onset   Cancer Mother        breast   Diabetes Mother    Multiple sclerosis Father    Cancer Father        colon ~2003   Stroke Father    Cancer Other        breast, colon   Diabetes Other    Stroke Other      Social History   Tobacco Use   Smoking status: Every Day    Packs/day: 0.30    Years: 8.00    Total pack years: 2.40    Types: Cigarettes   Smokeless tobacco: Never  Substance Use Topics   Alcohol use: Yes    Comment: occassionally   Drug use: No    Allergies as of 10/09/2021   (No Known Allergies)    Review of Systems:    All systems reviewed and negative except where noted in HPI.   Physical Exam:  BP 129/85   Pulse 75   Temp 98.5 F (36.9 C) (Oral)   Ht 6\' 2"  (1.88 m)   Wt  266 lb (120.7 kg)   BMI 34.15 kg/m  No LMP for male patient. Psych:  Alert and cooperative. Normal mood and affect. General:   Alert,  Well-developed, well-nourished, pleasant and cooperative in NAD Head:  Normocephalic and atraumatic. Eyes:  Sclera clear, no icterus.   Conjunctiva pink. Ears:  Normal auditory acuity.  Neurologic:  Alert and oriented x3;  grossly normal neurologically. Psych:  Alert and cooperative. Normal mood and affect.  Imaging Studies: No results found.  Assessment and Plan:   Aaron Mathis is a 36 y.o. y/o male has been referred for family history of colon cancer in his father at age of 64.  He has never had screening colonoscopy and hence he would require 1 at the age of 76.  I explained to him that if I find polyps I will send him for genetic testing as he has young children and would like to if he has something like Lynch syndrome which would change the age at which  his children need to be screened at  I have discussed alternative options, risks & benefits,  which include, but are not limited to, bleeding, infection, perforation,respiratory complication & drug reaction.  The patient agrees with this plan & written consent will be obtained.    Follow up in as needed  Dr Wyline Mood MD,MRCP(U.K)

## 2021-10-11 ENCOUNTER — Encounter: Payer: Self-pay | Admitting: Gastroenterology

## 2021-10-11 ENCOUNTER — Ambulatory Visit: Payer: 59 | Admitting: Anesthesiology

## 2021-10-11 ENCOUNTER — Encounter
Admission: RE | Disposition: A | Discharge status: Home / Self Care | Payer: Self-pay | Source: Home / Self Care | Attending: Gastroenterology

## 2021-10-11 ENCOUNTER — Ambulatory Visit
Admission: RE | Admit: 2021-10-11 | Discharge: 2021-10-11 | Disposition: A | Discharge status: Home / Self Care | Payer: 59 | Attending: Gastroenterology | Admitting: Gastroenterology

## 2021-10-11 DIAGNOSIS — Z1211 Encounter for screening for malignant neoplasm of colon: Secondary | ICD-10-CM | POA: Insufficient documentation

## 2021-10-11 DIAGNOSIS — Z800 Family history of malignant neoplasm of digestive organs: Secondary | ICD-10-CM | POA: Insufficient documentation

## 2021-10-11 DIAGNOSIS — F17290 Nicotine dependence, other tobacco product, uncomplicated: Secondary | ICD-10-CM | POA: Insufficient documentation

## 2021-10-11 DIAGNOSIS — F17210 Nicotine dependence, cigarettes, uncomplicated: Secondary | ICD-10-CM | POA: Insufficient documentation

## 2021-10-11 HISTORY — PX: COLONOSCOPY WITH PROPOFOL: SHX5780

## 2021-10-11 SURGERY — COLONOSCOPY WITH PROPOFOL
Anesthesia: General

## 2021-10-11 MED ORDER — SODIUM CHLORIDE 0.9 % IV SOLN: INTRAVENOUS | Status: DC

## 2021-10-11 MED ORDER — LIDOCAINE 2% (20 MG/ML) 5 ML SYRINGE
INTRAMUSCULAR | Status: DC | PRN
  Administered 2021-10-11: 50 mg via INTRAVENOUS

## 2021-10-11 MED ORDER — PROPOFOL 10 MG/ML IV BOLUS
INTRAVENOUS | Status: DC | PRN
  Administered 2021-10-11: 100 mg via INTRAVENOUS

## 2021-10-11 MED ORDER — PROPOFOL 500 MG/50ML IV EMUL
INTRAVENOUS | Status: DC | PRN
  Administered 2021-10-11: 150 ug/kg/min via INTRAVENOUS

## 2021-10-11 MED ORDER — PROPOFOL 1000 MG/100ML IV EMUL
INTRAVENOUS | Status: AC
  Filled 2021-10-11: qty 100

## 2021-10-11 NOTE — H&P (Signed)
Wyline Mood, MD [address], [place], [place], [address] [address], [address], [place], [place], [address] Phone: [phone]  Fax: [phone]  Primary Care Physician:  Ceasar Lund, PA   Pre-Procedure History & Physical: HPI:  Aaron Mathis is a 36 y.o. male is here for an colonoscopy.   Past Medical History:  Diagnosis Date   Headache(784.0)    History of chickenpox    History of depression    symptoms during time near father's dealth, resolved   Migraine headache     Past Surgical History:  Procedure Laterality Date   HERNIA REPAIR      Prior to Admission medications   Not on File    Allergies as of 10/09/2021   (No Known Allergies)    Family History  Problem Relation Age of Onset   Cancer Mother        breast   Diabetes Mother    Multiple sclerosis Father    Cancer Father        colon ~2003   Stroke Father    Cancer Other        breast, colon   Diabetes Other    Stroke Other     Social History   Socioeconomic History   Marital status: Married    Spouse name: Not on file   Number of children: Not on file   Years of education: Not on file   Highest education level: Not on file  Occupational History   Occupation: Publishing copy    Comment: Medical laboratory scientific officer in [place]  Tobacco Use   Smoking status: Every Day    Packs/day: 0.30    Years: 8.00    Total pack years: 2.40    Types: Cigarettes   Smokeless tobacco: Never  Vaping Use   Vaping Use: Every day  Substance and Sexual Activity   Alcohol use: Yes    Comment: occassionally   Drug use: No   Sexual activity: Not on file  Other Topics Concern   Not on file  Social History Narrative   Education: Tech, college   Regular exercise- no   From [place], moved to [place] at age 54   Married 2007   Social Determinants of Corporate investment banker Strain: Not on file  Food Insecurity: Not on file  Transportation Needs: Not on file  Physical Activity: Not on file   Stress: Not on file  Social Connections: Not on file  Intimate Partner Violence: Not on file    Review of Systems: See HPI, otherwise negative ROS  Physical Exam: BP 119/86   Pulse 73   Temp 98.2 F (36.8 C) (Temporal)   Resp 17   Ht 6\' 2"  (1.88 m)   Wt 117.9 kg   SpO2 100%   BMI 33.38 kg/m  General:   Alert,  pleasant and cooperative in NAD Head:  Normocephalic and atraumatic. Neck:  Supple; no masses or thyromegaly. Lungs:  Clear throughout to auscultation, normal respiratory effort.    Heart:  +S1, +S2, Regular rate and rhythm, No edema. Abdomen:  Soft, nontender and nondistended. Normal bowel sounds, without guarding, and without rebound.   Neurologic:  Alert and  oriented x4;  grossly normal neurologically.  Impression/Plan: Aaron Mathis is here for an colonoscopy to be performed for Screening colonoscopy father had colon cancer Risks, benefits, limitations, and alternatives regarding  colonoscopy have been reviewed with the patient.  Questions have been answered.  All parties agreeable.  Wyline Mood, MD  10/11/2021, 9:34 AM

## 2021-10-11 NOTE — Transfer of Care (Signed)
Immediate Anesthesia Transfer of Care Note  Patient: Aaron Mathis  Procedure(s) Performed: COLONOSCOPY WITH PROPOFOL  Patient Location: Endoscopy Unit  Anesthesia Type:General  Level of Consciousness: sedated  Airway & Oxygen Therapy: Patient Spontanous Breathing  Post-op Assessment: Report given to RN and Post -op Vital signs reviewed and stable  Post vital signs: Reviewed and stable  Last Vitals:  Vitals Value Taken Time  BP 106/73 10/11/21 0955  Temp 36.2 C 10/11/21 0955  Pulse 64 10/11/21 0956  Resp 7 10/11/21 0956  SpO2 98 % 10/11/21 0956  Vitals shown include unvalidated device data.  Last Pain:  Vitals:   10/11/21 0955  TempSrc: Temporal  PainSc: Asleep         Complications: No notable events documented.

## 2021-10-11 NOTE — Op Note (Signed)
[facility] Gastroenterology Patient Name: Aaron Mathis Procedure Date: 10/11/2021 9:35 AM MRN: [id number] Account #: [id number] Date of Birth: December 15, 1985 Admit Type: Outpatient Age: 36 Room: [facility] ENDO ROOM 3 Gender: Male Note Status: Finalized Instrument Name: Prentice Docker [id number] Procedure:             Colonoscopy Indications:           Screening in patient at increased risk: Family history                         of 1st-degree relative with colorectal cancer Providers:             Wyline Mood MD, MD Referring MD:          No Local Md, MD (Referring MD) Medicines:             Monitored Anesthesia Care Complications:         No immediate complications. Procedure:             Pre-Anesthesia Assessment:                        - Prior to the procedure, a History and Physical was                         performed, and patient medications, allergies and                         sensitivities were reviewed. The patient's tolerance                         of previous anesthesia was reviewed.                        - The risks and benefits of the procedure and the                         sedation options and risks were discussed with the                         patient. All questions were answered and informed                         consent was obtained.                        - ASA Grade Assessment: II - A patient with mild                         systemic disease.                        After obtaining informed consent, the colonoscope was                         passed under direct vision. Throughout the procedure,                         the patient's blood pressure, pulse, and oxygen  saturations were monitored continuously. The                         Colonoscope was introduced through the anus and                         advanced to the the cecum, identified by the                         appendiceal orifice. The colonoscopy was performed                          with ease. The patient tolerated the procedure well.                         The quality of the bowel preparation was excellent. Findings:      The perianal and digital rectal examinations were normal.      The entire examined colon appeared normal on direct and retroflexion       views. Impression:            - The entire examined colon is normal on direct and                         retroflexion views.                        - No specimens collected. Recommendation:        - Discharge patient to home (with escort).                        - Resume previous diet.                        - Continue present medications.                        - Repeat colonoscopy in 5 years for screening purposes. Procedure Code(s):     --- Professional ---                        [phone], Colonoscopy, flexible; diagnostic, including                         collection of specimen(s) by brushing or washing, when                         performed (separate procedure) Diagnosis Code(s):     --- Professional ---                        Z80.0, Family history of malignant neoplasm of                         digestive organs CPT copyright 2019 American Medical Association. All rights reserved. The codes documented in this report are preliminary and upon coder review may  be revised to meet current compliance requirements. Wyline Mood, MD Wyline Mood MD, MD 10/11/2021 9:52:57 AM This report has been signed electronically. Number of Addenda: 0 Note Initiated On: 10/11/2021 9:35 AM Scope Withdrawal Time: 0 hours 8 minutes 1  second  Total Procedure Duration: 0 hours 9 minutes 17 seconds  Estimated Blood Loss:  Estimated blood loss: none.      [facility]

## 2021-10-12 ENCOUNTER — Encounter: Payer: Self-pay | Admitting: Gastroenterology

## 2021-10-15 NOTE — Anesthesia Postprocedure Evaluation (Signed)
Anesthesia Post Note  Patient: Manpreet Corrow  Procedure(s) Performed: COLONOSCOPY WITH PROPOFOL  Patient location during evaluation: PACU Anesthesia Type: General Level of consciousness: awake and alert Pain management: pain level controlled Vital Signs Assessment: post-procedure vital signs reviewed and stable Respiratory status: spontaneous breathing, nonlabored ventilation, respiratory function stable and patient connected to nasal cannula oxygen Cardiovascular status: blood pressure returned to baseline and stable Postop Assessment: no apparent nausea or vomiting Anesthetic complications: no   No notable events documented.   Last Vitals:  Vitals:   10/11/21 1005 10/11/21 1015  BP: 109/74 116/82  Pulse: 64 69  Resp: 16 (!) 26  Temp:    SpO2: 100% 99%    Last Pain:  Vitals:   10/12/21 0744  TempSrc:   PainSc: 0-No pain                 Yevette Edwards
